# Patient Record
Sex: Female | Born: 1935 | Race: White | Hispanic: No | Smoking: Never smoker
Health system: Southern US, Community
[De-identification: ages and names within clinical notes are randomized; demographics above are authoritative.]

## PROBLEM LIST (undated history)

## (undated) DIAGNOSIS — K8689 Other specified diseases of pancreas: Secondary | ICD-10-CM

## (undated) DIAGNOSIS — R110 Nausea: Secondary | ICD-10-CM

## (undated) DIAGNOSIS — R42 Dizziness and giddiness: Secondary | ICD-10-CM

## (undated) DIAGNOSIS — I10 Essential (primary) hypertension: Secondary | ICD-10-CM

## (undated) DIAGNOSIS — R5382 Chronic fatigue, unspecified: Secondary | ICD-10-CM

## (undated) DIAGNOSIS — B351 Tinea unguium: Secondary | ICD-10-CM

## (undated) DIAGNOSIS — E860 Dehydration: Secondary | ICD-10-CM

## (undated) HISTORY — DX: Tinea unguium: B35.1

## (undated) HISTORY — DX: Other specified diseases of pancreas: K86.89

## (undated) HISTORY — DX: Essential (primary) hypertension: I10

## (undated) HISTORY — DX: Dizziness and giddiness: R42

## (undated) HISTORY — DX: Chronic fatigue, unspecified: R53.82

## (undated) HISTORY — DX: Nausea: R11.0

## (undated) HISTORY — DX: Dehydration: E86.0

---

## 1997-07-10 HISTORY — PX: THYROIDECTOMY: SHX17

## 2003-10-16 ENCOUNTER — Emergency Department (HOSPITAL_COMMUNITY): Admission: EM | Admit: 2003-10-16 | Discharge: 2003-10-16 | Payer: Self-pay | Admitting: Family Medicine

## 2016-04-09 DEATH — deceased

## 2020-01-21 ENCOUNTER — Other Ambulatory Visit: Payer: Self-pay

## 2020-01-21 ENCOUNTER — Ambulatory Visit (INDEPENDENT_AMBULATORY_CARE_PROVIDER_SITE_OTHER): Payer: Medicare Other | Admitting: Podiatry

## 2020-01-21 DIAGNOSIS — B351 Tinea unguium: Secondary | ICD-10-CM

## 2020-01-21 DIAGNOSIS — M79676 Pain in unspecified toe(s): Secondary | ICD-10-CM

## 2020-01-23 NOTE — Progress Notes (Signed)
   SUBJECTIVE Patient presents to office today complaining of elongated, thickened nails that cause pain while ambulating in shoes.  She is unable to trim her own nails.  Patient is on anticoagulant therapy and concern to trim her nails.  She states that her nails have not been trimmed for over a year due to Covid.  They are long and thick and growing upwards and are very painful and tender with shoes.  Patient is here for further evaluation and treatment.  No past medical history on file.  OBJECTIVE General Patient is awake, alert, and oriented x 3 and in no acute distress. Derm Skin is dry and supple bilateral. Negative open lesions or macerations. Remaining integument unremarkable. Nails are tender, long, thickened and dystrophic with subungual debris, consistent with onychomycosis, 1-5 bilateral. No signs of infection noted. Vasc  DP and PT pedal pulses palpable bilaterally. Temperature gradient within normal limits.  Neuro Epicritic and protective threshold sensation grossly intact bilaterally.  Musculoskeletal Exam No symptomatic pedal deformities noted bilateral. Muscular strength within normal limits.  ASSESSMENT 1. Onychodystrophic nails 1-5 bilateral with hyperkeratosis of nails.  2. Onychomycosis of nail due to dermatophyte bilateral 3. Pain in foot bilateral  PLAN OF CARE 1. Patient evaluated today.  2. Instructed to maintain good pedal hygiene and foot care.  3. Mechanical debridement of nails 1-5 bilaterally performed using a nail nipper. Filed with dremel without incident.  4. Return to clinic in 3 mos.    Felecia Shelling, DPM [facility]  Dr. Felecia Shelling, DPM    [address]                                        [place], [place] [address]                Office [phone]  Fax [phone]

## 2020-02-04 ENCOUNTER — Encounter: Payer: Self-pay | Admitting: Nurse Practitioner

## 2020-02-04 ENCOUNTER — Other Ambulatory Visit: Payer: Self-pay

## 2020-02-04 ENCOUNTER — Ambulatory Visit (INDEPENDENT_AMBULATORY_CARE_PROVIDER_SITE_OTHER): Payer: Medicare Other | Admitting: Nurse Practitioner

## 2020-02-04 VITALS — BP 162/70 | HR 61 | Temp 97.1°F | Ht 63.0 in | Wt 144.0 lb

## 2020-02-04 DIAGNOSIS — K5790 Diverticulosis of intestine, part unspecified, without perforation or abscess without bleeding: Secondary | ICD-10-CM | POA: Diagnosis not present

## 2020-02-04 DIAGNOSIS — E890 Postprocedural hypothyroidism: Secondary | ICD-10-CM

## 2020-02-04 DIAGNOSIS — R531 Weakness: Secondary | ICD-10-CM

## 2020-02-04 DIAGNOSIS — K869 Disease of pancreas, unspecified: Secondary | ICD-10-CM

## 2020-02-04 DIAGNOSIS — R911 Solitary pulmonary nodule: Secondary | ICD-10-CM

## 2020-02-04 DIAGNOSIS — I10 Essential (primary) hypertension: Secondary | ICD-10-CM

## 2020-02-04 DIAGNOSIS — R5383 Other fatigue: Secondary | ICD-10-CM

## 2020-02-04 DIAGNOSIS — R634 Abnormal weight loss: Secondary | ICD-10-CM

## 2020-02-04 DIAGNOSIS — Z8673 Personal history of transient ischemic attack (TIA), and cerebral infarction without residual deficits: Secondary | ICD-10-CM

## 2020-02-04 DIAGNOSIS — Z9009 Acquired absence of other part of head and neck: Secondary | ICD-10-CM | POA: Diagnosis not present

## 2020-02-04 DIAGNOSIS — B351 Tinea unguium: Secondary | ICD-10-CM

## 2020-02-04 DIAGNOSIS — E559 Vitamin D deficiency, unspecified: Secondary | ICD-10-CM

## 2020-02-04 DIAGNOSIS — M79676 Pain in unspecified toe(s): Secondary | ICD-10-CM

## 2020-02-04 DIAGNOSIS — R42 Dizziness and giddiness: Secondary | ICD-10-CM

## 2020-02-04 NOTE — Progress Notes (Signed)
Careteam: Patient Care Team: Sharon SellerEubanks, Shakeena Kafer K, NP as PCP - General (Geriatric Medicine)  PL[facility] SERVICE[facility]  [facility]  Advanced Directive information    No Known Allergies  Chief Complaint  Patient presents with   Establish Care     New patient establish care. Here with son, Trey PaulaJeff    Nausea    Patient c/o nausea, takes baking soda to help with symptoms. Question if any of current medications is the source of nausea.    Fatigue    Patient c/o no energy and unable to stand alone. Patient uses a walker.    Medication Management    All medications present at initial appointment      HPI: Patient is a 86 y.o. female to establish care.   Osteopenia- unsure when her last bone density but "not too long ago"  Elevated HR/hypertension- taking cardizem 120 mg and irbesartan 150 mg daily, previously blood pressure well controlled. HR controlled generally. Has been on same medication for years.   Chronic back pain- uses gabapentin as needed and mostly just takes at night. Rarely will take during the day.   Reports hx of diverticulitis and feels like she was started on omeprazole  Talks a lot about flare up of her diverticulitis.   She has a hx of vertigo and then she would be nauseous then she would stop eating and drinking and end up hospitalized with dehydration. Vertigo has been chronic and unable to lay flat, sleeps with elevated bed. Family ensures that she is fully hydrated 8 8 oz glasses of water a day. Daughter cooks for her. She has no appetite.  No vomiting.  Taking baking soda which helps the nausea.   AWV- last in November --11/10 and last blood work around that time.     Reports ongoing weakness- she also is working to improve painful fungal infection to her feet and toes.   Review of Systems:  Review of Systems  Constitutional: Positive for malaise/fatigue and weight loss. Negative for chills and fever.  HENT: Negative for tinnitus.   Respiratory:  Negative for cough, sputum production and shortness of breath.   Cardiovascular: Negative for chest pain, palpitations and leg swelling.  Gastrointestinal: Positive for diarrhea and nausea. Negative for abdominal pain, constipation and heartburn.  Genitourinary: Positive for frequency. Negative for dysuria and urgency.  Musculoskeletal: Positive for back pain. Negative for falls, joint pain and myalgias.  Skin: Negative.        Fungal infection to bilateral feet/toenails being followed by podiatry   Neurological: Positive for dizziness and weakness. Negative for headaches.  Psychiatric/Behavioral: Negative for depression and memory loss. The patient does not have insomnia.     Past Medical History:  Diagnosis Date   Chronic fatigue    Per [facility] new patient packet   Dehydration    Per [facility] new patient packet   High blood pressure    Per [facility] new patient packet   Mass of pancreas    Per [facility] new patient packet   Nausea    Per [facility] new patient packet   Toenail fungus    Per [facility] new patient packet   Vertigo    Per [facility] new patient packet   Past Surgical History:  Procedure Laterality Date   THYROIDECTOMY  1999   Per [facility] new patient packet   Social History:   reports that she has never smoked. She has never used smokeless tobacco. She reports previous alcohol use. She reports that  she does not use drugs.  Family History  Problem Relation Age of Onset   Heart attack Mother    Gaucher's disease Mother    Heart attack Father    Heart attack Brother    Thyroid disease Daughter    High blood pressure Son     Medications: Patient's Medications  New Prescriptions   No medications on file  Previous Medications   CHOLECALCIFEROL 25 MCG (1000 UT) CAPSULE    Take 1 capsule by mouth daily.   DILTIAZEM (CARDIZEM CD) 120 MG 24 HR CAPSULE    Take 120 mg by mouth daily.   GABAPENTIN (NEURONTIN) 100 MG CAPSULE    Take 1 capsule by mouth 2 (two) times daily.   IRBESARTAN  (AVAPRO) 150 MG TABLET    Take 150 mg by mouth daily.   MULTIPLE VITAMIN (MULTI-VITAMIN) TABLET    Take 1 tablet by mouth daily.   OMEPRAZOLE (PRILOSEC) 20 MG CAPSULE    Take 20 mg by mouth daily.  Modified Medications   No medications on file  Discontinued Medications   No medications on file    Physical Exam:  Vitals:   02/04/20 1328  BP: (!) 162/70  Pulse: 61  Temp: (!) 97.1 F (36.2 C)  TempSrc: Temporal  SpO2: 97%  Weight: 144 lb (65.3 kg)  Height: 5\' 3"  (1.6 m)   Body mass index is 25.51 kg/m. Wt Readings from Last 3 Encounters:  02/04/20 144 lb (65.3 kg)    Physical Exam Constitutional:      General: She is not in acute distress.    Appearance: She is well-developed. She is not diaphoretic.  HENT:     Head: Normocephalic and atraumatic.     Mouth/Throat:     Pharynx: No oropharyngeal exudate.  Eyes:     Conjunctiva/sclera: Conjunctivae normal.     Pupils: Pupils are equal, round, and reactive to light.  Cardiovascular:     Rate and Rhythm: Normal rate and regular rhythm.     Heart sounds: Normal heart sounds.  Pulmonary:     Effort: Pulmonary effort is normal.     Breath sounds: Normal breath sounds.  Abdominal:     General: Bowel sounds are normal.     Palpations: Abdomen is soft.  Musculoskeletal:        General: No tenderness.     Cervical back: Normal range of motion and neck supple.  Skin:    General: Skin is warm and dry.  Neurological:     Mental Status: She is alert and oriented to person, place, and time.     Gait: Gait abnormal (in wheelchair).     Labs reviewed: Basic Metabolic Panel: No results for input(s): NA, K, CL, CO2, GLUCOSE, BUN, CREATININE, CALCIUM, MG, PHOS, TSH in the last 8760 hours. Liver Function Tests: No results for input(s): AST, ALT, ALKPHOS, BILITOT, PROT, ALBUMIN in the last 8760 hours. No results for input(s): LIPASE, AMYLASE in the last 8760 hours. No results for input(s): AMMONIA in the last 8760  hours. CBC: No results for input(s): WBC, NEUTROABS, HGB, HCT, MCV, PLT in the last 8760 hours. Lipid Panel: No results for input(s): CHOL, HDL, LDLCALC, TRIG, CHOLHDL, LDLDIRECT in the last 8760 hours. TSH: No results for input(s): TSH in the last 8760 hours. A1C: No results found for: HGBA1C  FROM [facility] MY[facility]ART CT ABDOMEN PELVIS WITH CONTRAST  Comparison: None.  Indication: Unlisted Indication-See Free Text, diarrhea, low back pain, R53.1 Weakness.  Technique: CT imaging was  performed of the abdomen and pelvis following the administration of intravenous contrast ([id number]-300). Iodinated contrast was used due to the indications for the examination, to improve disease detection and further define anatomy. The most recent serum creatinine is Within normal limits. Coronal and sagittal reformatted images were generated and reviewed.  Findings:   There is a 0.6 cm solid-appearing nodule in the right middle lobe (axial image 13). Linear opacities in the lingula likely represent scarring versus subsegmental atelectasis. There are no pleural effusions.  Fatty infiltration of the falciform ligament. Multiple low-attenuation lesions in the liver likely represent cysts, though some are too small to fully characterize. The liver is otherwise unremarkable. No intra or extrahepatic biliary ductal dilatation. Normal gallbladder.  Coarse calcification is noted in the left adrenal gland, nonspecific. The spleen is unremarkable.  There is a 1.9 x 1.2 cm low-attenuation lesion in the pancreatic tail (11 HU on axial image 44). There may be an additional 0.6 cm low-attenuation lesion in the region of the pancreatic head (axial image 52). Low-attenuation lesions in the bilateral kidneys are too small to fully characterize. Right extrarenal pelvis. Mild left atelectasis versus parapelvic cysts without clear obstructing cause identified.  Small hiatal hernia. Diverticulosis. There is  focal bowel wall thickening in the region of the sigmoid colon with mild surrounding mesenteric stranding. The appendix is not clearly visualized, however, no findings in the expected region of the appendix to suggest acute appendicitis. No free air. No free fluid. There is no evidence of retroperitoneal, pelvic, mesenteric, or inguinal lymphadenopathy. Small fat-containing umbilical hernia.  The aorta is normal in caliber. Moderate to severe atherosclerotic plaque throughout the visualized thoracic and abdominal aorta.   The bladder is normal. The uterus and ovaries are unremarkable in appearance.  The bone windows demonstrate no evidence of destructive, lytic, or blastic lesions.  Impression:  1. Findings favored to represent mild uncomplicated sigmoid diverticulitis. No evidence of perforation or abscess.  Recommend correlation with colonoscopy. 2. Incidentally identified 1.9 centimeter low-attenuation cystic lesion in the pancreatic tail. This could represent an IPMN, cystic neoplasm, or pseudocyst. A 1 year follow-up CT is recommended to ensure stability.  3. There is an apparent 0.6 cm right middle lobe pulmonary nodule. If the patient is low risk for lung cancer, recommend follow-up CT at 6-12 months and optional CT at 18-24 months. If the patient is high risk for lung cancer, recommend follow-up CT at 6-12 months and again at 18-24 months to ensure stability. (2017 Fleischner Guidelines)  This study was placed in the unexpected findings folder for impressions #2 and 3.  Electronically Signed by: Harvin Hazel, MD, [facility] Electronically Signed on: 05/27/2018 4:50 PM  --------  CT BRAIN WITHOUT CONTRAST  Comparison: 08/20/2015  Indication: Unlisted Indication-See Free Text, AMS, R53.1 Weakness  Technique: Contiguous axial images were obtained from the skull base through the vertex without IV contrast.   Findings:  No evidence of mass effect or  midline shift. Negative for space-occupying lesion or intracranial hemorrhage. No evidence of acute cortical-based area of infarction. Periventricular hypodensities compatible with chronic small vessel ischemic changes. Vascular calcifications. Old lacunar infarct left basal ganglia.  No extra-axial fluid collections. Ventricles and sulci are appropriate for the patient's age. Basal cisterns are patent.  Status post bilateral cataract surgery. Paranasal sinuses and mastoids are normal.   Impression:  No acute intracranial process.  Electronically Signed by: Harvin Hazel, MD, [facility] Electronically Signed on: 05/27/2018 4:18 PM  Assessment/Plan 1. H/O partial thyroidectomy - TSH  2. Essential hypertension -elevated today in office but she does not like coming to appts. Encouraged home monitoring and to notify if remains elevated, goal <140/90. Low sodium diet encouraged. - CBC with Differential/Platelet - COMPLETE METABOLIC PANEL WITH GFR  3. Diverticulosis -hx of with flares in the past, diet encouraged.  - CBC with Differential/Platelet - COMPLETE METABOLIC PANEL WITH GFR  4. Pancreatic lesion Noted on imaging, would like follow up.  - CT Abdomen Pelvis W Contrast; Future  5. Lesion of right lung -noted on imaging, would like follow up. - CT Chest Wo Contrast; Future  6. Fatigue, unspecified type Ongoing. Will follow up labs at this time. To continue proper hydration and nutrition. Encouraged increase in physical activity as tolerates, she is very sedentary.  - TSH  7. Weakness Due to debility, recommend PT/OT she wants to hold off until onychomycosis of toenails improves further.   8. Pain due to onychomycosis of toenail Being treated by podiatry, with improvement. Previously was painful to walk this is now improving however with increase weakness and balance issues. She would likely benefit from PT but would like to hold off until treatment is  complete.  9. Vertigo Long standing issues. Family make sure she is properly hydrated with proper nutrition.   10. Postoperative hypothyroidism TSH  11. H/O: CVA (cerebrovascular accident) Question if hx of cva contributing to vertigo. ?not on ASA, possible due to GI upset with hx of nausea  12. Vitamin D deficiency Continues on supplement, last Vit D level in nov was 58   13. Weight loss On review of records her weight was 130 lbs in nov 2020 so it appears she has had a positive weight gain at this time.   Next appt: 4 months, sooner if needed  Danton Palmateer K. Biagio Borg  [facility] [phone]

## 2020-02-04 NOTE — Patient Instructions (Addendum)
Continue to work on getting 3 meals a day with proper water intake  To schedule AWV at after 05/19/2020  Follow up in 4 months.

## 2020-02-05 ENCOUNTER — Ambulatory Visit: Payer: Self-pay | Admitting: Nurse Practitioner

## 2020-02-05 LAB — COMPLETE METABOLIC PANEL WITH GFR
AG Ratio: 1.3 (calc) (ref 1.0–2.5)
ALT: 12 U/L (ref 6–29)
AST: 16 U/L (ref 10–35)
Albumin: 4 g/dL (ref 3.6–5.1)
Alkaline phosphatase (APISO): 95 U/L (ref 37–153)
BUN: 15 mg/dL (ref 7–25)
CO2: 26 mmol/L (ref 20–32)
Calcium: 9.3 mg/dL (ref 8.6–10.4)
Chloride: 97 mmol/L — ABNORMAL LOW (ref 98–110)
Creat: 0.84 mg/dL (ref 0.60–0.88)
GFR, Est African American: 69 mL/min/{1.73_m2} (ref >=60)
GFR, Est Non African American: 60 mL/min/{1.73_m2} (ref >=60)
Globulin: 3.2 g/dL (calc) (ref 1.9–3.7)
Glucose, Bld: 103 mg/dL (ref 65–139)
Potassium: 4.6 mmol/L (ref 3.5–5.3)
Sodium: 132 mmol/L — ABNORMAL LOW (ref 135–146)
Total Bilirubin: 0.4 mg/dL (ref 0.2–1.2)
Total Protein: 7.2 g/dL (ref 6.1–8.1)

## 2020-02-05 LAB — CBC WITH DIFFERENTIAL/PLATELET
Absolute Monocytes: 813 cells/uL (ref 200–950)
Basophils Absolute: 20 cells/uL (ref 0–200)
Basophils Relative: 0.3 %
Eosinophils Absolute: 39 cells/uL (ref 15–500)
Eosinophils Relative: 0.6 %
HCT: 39.7 % (ref 35.0–45.0)
Hemoglobin: 13 g/dL (ref 11.7–15.5)
Lymphs Abs: 1365 cells/uL (ref 850–3900)
MCH: 29.8 pg (ref 27.0–33.0)
MCHC: 32.7 g/dL (ref 32.0–36.0)
MCV: 91.1 fL (ref 80.0–100.0)
MPV: 9.3 fL (ref 7.5–12.5)
Monocytes Relative: 12.5 %
Neutro Abs: 4264 cells/uL (ref 1500–7800)
Neutrophils Relative %: 65.6 %
Platelets: 282 10*3/uL (ref 140–400)
RBC: 4.36 10*6/uL (ref 3.80–5.10)
RDW: 11.8 % (ref 11.0–15.0)
Total Lymphocyte: 21 %
WBC: 6.5 10*3/uL (ref 3.8–10.8)

## 2020-02-05 LAB — TSH: TSH: 1.4 mIU/L (ref 0.40–4.50)

## 2020-02-06 DIAGNOSIS — E890 Postprocedural hypothyroidism: Secondary | ICD-10-CM | POA: Insufficient documentation

## 2020-02-06 DIAGNOSIS — M79676 Pain in unspecified toe(s): Secondary | ICD-10-CM | POA: Insufficient documentation

## 2020-02-06 DIAGNOSIS — I10 Essential (primary) hypertension: Secondary | ICD-10-CM | POA: Insufficient documentation

## 2020-02-06 DIAGNOSIS — R42 Dizziness and giddiness: Secondary | ICD-10-CM | POA: Insufficient documentation

## 2020-02-06 DIAGNOSIS — K5790 Diverticulosis of intestine, part unspecified, without perforation or abscess without bleeding: Secondary | ICD-10-CM | POA: Insufficient documentation

## 2020-02-06 DIAGNOSIS — E559 Vitamin D deficiency, unspecified: Secondary | ICD-10-CM | POA: Insufficient documentation

## 2020-02-06 DIAGNOSIS — Z8673 Personal history of transient ischemic attack (TIA), and cerebral infarction without residual deficits: Secondary | ICD-10-CM | POA: Insufficient documentation

## 2020-03-22 ENCOUNTER — Other Ambulatory Visit: Payer: Self-pay

## 2020-03-22 ENCOUNTER — Ambulatory Visit (INDEPENDENT_AMBULATORY_CARE_PROVIDER_SITE_OTHER): Payer: Medicare Other | Admitting: Nurse Practitioner

## 2020-03-22 VITALS — BP 144/82 | HR 94 | Temp 97.1°F

## 2020-03-22 DIAGNOSIS — R413 Other amnesia: Secondary | ICD-10-CM | POA: Diagnosis not present

## 2020-03-22 DIAGNOSIS — R8290 Unspecified abnormal findings in urine: Secondary | ICD-10-CM | POA: Diagnosis not present

## 2020-03-22 DIAGNOSIS — R531 Weakness: Secondary | ICD-10-CM

## 2020-03-22 DIAGNOSIS — B351 Tinea unguium: Secondary | ICD-10-CM

## 2020-03-22 DIAGNOSIS — M79676 Pain in unspecified toe(s): Secondary | ICD-10-CM

## 2020-03-22 NOTE — Progress Notes (Signed)
Careteam: Patient Care Team: Sharon Seller, NP as PCP - General (Geriatric Medicine)  PLACE OF SERVICE:  [facility]  Advanced Directive information    No Known Allergies  Chief Complaint  Patient presents with  . Acute Visit    Per patient feels bad all the time and less engery. Patient son questions if patient had a recent stroke. Patient also with frequent urination. Patient denies pain or discomfort when urinating. Patient also would like ears checked. Patients son would also like standard blood work checked. Here with son [name].   . Referral    Home health referral request   . Immunizations    Refused flu vaccine today      HPI: Patient is a 86 y.o. female with progressive weakness.   Son reports she is doing more sleeping.  Reports she feels weak getting up and getting down. More forgetful.  In the last few weeks having a hard time having sentences.  Reports ongoing dizziness, can not lay flat due to this. Sleeps propped up, has been doing that for years.   Has ocular migraines, more this past week. They last about 20 mins.   Reports they are waiting to have physical therapy at this time. Has gotten progressively weak after painful toenails.  Not eating well at all.  Did not want to be weighed today, son reports he feels like the weight was off at last visit.   No pain or abnormal urination    Review of Systems:  Review of Systems  Constitutional: Positive for malaise/fatigue and weight loss. Negative for chills and fever.  HENT: Positive for hearing loss. Negative for tinnitus.   Respiratory: Negative for cough, sputum production and shortness of breath.   Cardiovascular: Negative for chest pain, palpitations and leg swelling.  Gastrointestinal: Negative for abdominal pain, constipation, diarrhea and heartburn.  Genitourinary: Negative for dysuria, frequency and urgency.  Musculoskeletal: Negative for back pain, falls, joint pain and myalgias.  Skin:  Negative.   Neurological: Positive for weakness and headaches. Negative for dizziness.  Psychiatric/Behavioral: Positive for memory loss. Negative for depression. The patient is nervous/anxious. The patient does not have insomnia.     Past Medical History:  Diagnosis Date  . Chronic fatigue    Per [facility] new patient packet  . Dehydration    Per [facility] new patient packet  . High blood pressure    Per [facility] new patient packet  . Mass of pancreas    Per [facility] new patient packet  . Nausea    Per [facility] new patient packet  . Toenail fungus    Per [facility] new patient packet  . Vertigo    Per [facility] new patient packet   Past Surgical History:  Procedure Laterality Date  . THYROIDECTOMY  1999   Per [facility] new patient packet   Social History:   reports that she has never smoked. She has never used smokeless tobacco. She reports previous alcohol use. She reports that she does not use drugs.  Family History  Problem Relation Age of Onset  . Heart attack Mother   . Gaucher's disease Mother   . Heart attack Father   . Heart attack Brother   . Thyroid disease Daughter   . High blood pressure Son     Medications: Patient's Medications  New Prescriptions   No medications on file  Previous Medications   CHOLECALCIFEROL 25 MCG (1000 UT) CAPSULE    Take 1 capsule by mouth daily.  DILTIAZEM (CARDIZEM CD) 120 MG 24 HR CAPSULE    Take 120 mg by mouth daily.   GABAPENTIN (NEURONTIN) 100 MG CAPSULE    Take 1 capsule by mouth as needed.    IRBESARTAN (AVAPRO) 150 MG TABLET    Take 150 mg by mouth daily.   MULTIPLE VITAMIN (MULTI-VITAMIN) TABLET    Take 1 tablet by mouth daily.  Modified Medications   No medications on file  Discontinued Medications   OMEPRAZOLE (PRILOSEC) 20 MG CAPSULE    Take 20 mg by mouth daily.    Physical Exam:  Vitals:   03/22/20 1600  BP: (!) 144/82  Pulse: 94  Temp: (!) 97.1 F (36.2 C)  TempSrc: Temporal  SpO2: 97%   There is no height or weight on file to calculate  BMI. Wt Readings from Last 3 Encounters:  02/04/20 144 lb (65.3 kg)    Physical Exam Constitutional:      General: She is not in acute distress.    Appearance: She is well-developed. She is not diaphoretic.  HENT:     Head: Normocephalic and atraumatic.     Mouth/Throat:     Pharynx: No oropharyngeal exudate.  Eyes:     Conjunctiva/sclera: Conjunctivae normal.     Pupils: Pupils are equal, round, and reactive to light.  Cardiovascular:     Rate and Rhythm: Normal rate and regular rhythm.     Heart sounds: Normal heart sounds.  Pulmonary:     Effort: Pulmonary effort is normal.     Breath sounds: Normal breath sounds.  Abdominal:     General: Bowel sounds are normal.     Palpations: Abdomen is soft.  Musculoskeletal:        General: No tenderness.     Cervical back: Normal range of motion and neck supple.     Right lower leg: No edema.     Left lower leg: No edema.  Skin:    General: Skin is warm and dry.  Neurological:     Mental Status: She is alert and oriented to person, place, and time.     Sensory: No sensory deficit.     Motor: Weakness (slow gait, uses walker and wheelchair) present.     Gait: Gait abnormal.  Psychiatric:        Behavior: Behavior normal.     Labs reviewed: Basic Metabolic Panel: Recent Labs    02/04/20 1432  NA 132*  K 4.6  CL 97*  CO2 26  GLUCOSE 103  BUN 15  CREATININE 0.84  CALCIUM 9.3  TSH 1.40   Liver Function Tests: Recent Labs    02/04/20 1432  AST 16  ALT 12  BILITOT 0.4  PROT 7.2   No results for input(s): LIPASE, AMYLASE in the last 8760 hours. No results for input(s): AMMONIA in the last 8760 hours. CBC: Recent Labs    02/04/20 1432  WBC 6.5  NEUTROABS 4,264  HGB 13.0  HCT 39.7  MCV 91.1  PLT 282   Lipid Panel: No results for input(s): CHOL, HDL, LDLCALC, TRIG, CHOLHDL, LDLDIRECT in the last 8760 hours. TSH: Recent Labs    02/04/20 1432  TSH 1.40   A1C: No results found for:  HGBA1C   Assessment/Plan 1. Weakness -progressive weakness, ?related to progressive decline vs acute infection/stroke Pt unable to tolerate CT head -will get labs to rule out infection or electrolyte abnormality -son request Vit d level.  - Ambulatory referral to Home Health - Vitamin B12 -  COMPLETE METABOLIC PANEL WITH GFR - Vitamin D, 25-hydroxy - CBC with Differential/Platelet  2. Pain due to onychomycosis of toenail -pain has made it harder for her to walk, has been seeing podiatry with improvement in pain, now with worsening weakness  - Ambulatory referral to Home Health for PT/OT evaluation  3. Memory loss -worsening confusion, son concerned over stroke vs infection.  -pt and son reports she would not be able to tolerate CT scan for imaging and therefore decline test.  - Vitamin B12 - COMPLETE METABOLIC PANEL WITH GFR - Vitamin D, 25-hydroxy - CBC with Differential/Platelet  Next appt: 06/09/2020 Erin Escobar. Biagio Borg  [facility] [phone]

## 2020-03-23 LAB — COMPLETE METABOLIC PANEL WITH GFR
AG Ratio: 1.5 (calc) (ref 1.0–2.5)
ALT: 11 U/L (ref 6–29)
AST: 14 U/L (ref 10–35)
Albumin: 4.1 g/dL (ref 3.6–5.1)
Alkaline phosphatase (APISO): 89 U/L (ref 37–153)
BUN/Creatinine Ratio: 15 (calc) (ref 6–22)
BUN: 14 mg/dL (ref 7–25)
CO2: 25 mmol/L (ref 20–32)
Calcium: 9.4 mg/dL (ref 8.6–10.4)
Chloride: 96 mmol/L — ABNORMAL LOW (ref 98–110)
Creat: 0.94 mg/dL — ABNORMAL HIGH (ref 0.60–0.88)
GFR, Est African American: 61 mL/min/{1.73_m2} (ref >=60)
GFR, Est Non African American: 52 mL/min/{1.73_m2} — ABNORMAL LOW (ref >=60)
Globulin: 2.8 g/dL (calc) (ref 1.9–3.7)
Glucose, Bld: 116 mg/dL (ref 65–139)
Potassium: 4.4 mmol/L (ref 3.5–5.3)
Sodium: 130 mmol/L — ABNORMAL LOW (ref 135–146)
Total Bilirubin: 0.4 mg/dL (ref 0.2–1.2)
Total Protein: 6.9 g/dL (ref 6.1–8.1)

## 2020-03-23 LAB — CBC WITH DIFFERENTIAL/PLATELET
Absolute Monocytes: 876 cells/uL (ref 200–950)
Basophils Absolute: 41 cells/uL (ref 0–200)
Basophils Relative: 0.6 %
Eosinophils Absolute: 21 cells/uL (ref 15–500)
Eosinophils Relative: 0.3 %
HCT: 38.2 % (ref 35.0–45.0)
Hemoglobin: 12.8 g/dL (ref 11.7–15.5)
Lymphs Abs: 1697 cells/uL (ref 850–3900)
MCH: 30.4 pg (ref 27.0–33.0)
MCHC: 33.5 g/dL (ref 32.0–36.0)
MCV: 90.7 fL (ref 80.0–100.0)
MPV: 9.4 fL (ref 7.5–12.5)
Monocytes Relative: 12.7 %
Neutro Abs: 4264 cells/uL (ref 1500–7800)
Neutrophils Relative %: 61.8 %
Platelets: 297 10*3/uL (ref 140–400)
RBC: 4.21 10*6/uL (ref 3.80–5.10)
RDW: 12.1 % (ref 11.0–15.0)
Total Lymphocyte: 24.6 %
WBC: 6.9 10*3/uL (ref 3.8–10.8)

## 2020-03-23 LAB — VITAMIN D 25 HYDROXY (VIT D DEFICIENCY, FRACTURES): Vit D, 25-Hydroxy: 52 ng/mL (ref 30–100)

## 2020-03-23 LAB — VITAMIN B12: Vitamin B-12: 933 pg/mL (ref 200–1100)

## 2020-03-24 ENCOUNTER — Other Ambulatory Visit: Payer: Self-pay

## 2020-03-24 DIAGNOSIS — E871 Hypo-osmolality and hyponatremia: Secondary | ICD-10-CM

## 2020-03-24 LAB — URINE CULTURE
MICRO NUMBER:: 10945266
Result:: NO GROWTH
SPECIMEN QUALITY:: ADEQUATE

## 2020-03-25 ENCOUNTER — Other Ambulatory Visit: Payer: Self-pay

## 2020-03-25 ENCOUNTER — Telehealth: Payer: Self-pay | Admitting: *Deleted

## 2020-03-25 DIAGNOSIS — E871 Hypo-osmolality and hyponatremia: Secondary | ICD-10-CM

## 2020-03-25 NOTE — Telephone Encounter (Signed)
[place], daughter called and wanted to know since patient's sodium is low could that cause Confusion. Stated that several mornings patient wakes up in the morning with confusion. Stated that patient does go to bed super early. Stated that it gets better after patient gets food and drink.  Please Advise.

## 2020-03-25 NOTE — Telephone Encounter (Signed)
Patient daughter notified and agreed.  

## 2020-03-25 NOTE — Telephone Encounter (Signed)
Could be the cause but generally it would not be transient confusion. I know that she has had progressive confusion and does have hx of stroke based on her chart, this can also lead to confusion.

## 2020-03-27 LAB — OSMOLALITY, URINE: Osmolality, Ur: 444 mOsm/kg (ref 50–1200)

## 2020-03-27 LAB — SODIUM, URINE, RANDOM: Sodium, Ur: 69 mmol/L (ref 28–272)

## 2020-03-31 ENCOUNTER — Telehealth: Payer: Self-pay

## 2020-03-31 NOTE — Telephone Encounter (Signed)
Charisse from [facility] states delay in care giver request so home health is starting today.

## 2020-04-01 ENCOUNTER — Emergency Department (HOSPITAL_COMMUNITY): Payer: Medicare Other

## 2020-04-01 ENCOUNTER — Encounter (HOSPITAL_COMMUNITY): Payer: Self-pay | Admitting: Emergency Medicine

## 2020-04-01 ENCOUNTER — Emergency Department (HOSPITAL_COMMUNITY)
Admission: EM | Admit: 2020-04-01 | Discharge: 2020-04-09 | Disposition: E | Payer: Medicare Other | Attending: Emergency Medicine | Admitting: Emergency Medicine

## 2020-04-01 DIAGNOSIS — I469 Cardiac arrest, cause unspecified: Secondary | ICD-10-CM | POA: Insufficient documentation

## 2020-04-01 DIAGNOSIS — R4182 Altered mental status, unspecified: Secondary | ICD-10-CM | POA: Insufficient documentation

## 2020-04-01 DIAGNOSIS — Z79899 Other long term (current) drug therapy: Secondary | ICD-10-CM | POA: Insufficient documentation

## 2020-04-01 DIAGNOSIS — I4901 Ventricular fibrillation: Secondary | ICD-10-CM

## 2020-04-01 DIAGNOSIS — I10 Essential (primary) hypertension: Secondary | ICD-10-CM | POA: Diagnosis not present

## 2020-04-01 DIAGNOSIS — E039 Hypothyroidism, unspecified: Secondary | ICD-10-CM | POA: Diagnosis not present

## 2020-04-01 DIAGNOSIS — Z20822 Contact with and (suspected) exposure to covid-19: Secondary | ICD-10-CM | POA: Diagnosis not present

## 2020-04-01 LAB — I-STAT CHEM 8, ED
BUN: 19 mg/dL (ref 8–23)
Calcium, Ion: 1.13 mmol/L — ABNORMAL LOW (ref 1.15–1.40)
Chloride: 101 mmol/L (ref 98–111)
Creatinine, Ser: 1 mg/dL (ref 0.44–1.00)
Glucose, Bld: 292 mg/dL — ABNORMAL HIGH (ref 70–99)
HCT: 32 % — ABNORMAL LOW (ref 36.0–46.0)
Hemoglobin: 10.9 g/dL — ABNORMAL LOW (ref 12.0–15.0)
Potassium: 3.2 mmol/L — ABNORMAL LOW (ref 3.5–5.1)
Sodium: 133 mmol/L — ABNORMAL LOW (ref 135–145)
TCO2: 18 mmol/L — ABNORMAL LOW (ref 22–32)

## 2020-04-01 LAB — I-STAT ARTERIAL BLOOD GAS, ED
Acid-base deficit: 9 mmol/L — ABNORMAL HIGH (ref 0.0–2.0)
Bicarbonate: 19.1 mmol/L — ABNORMAL LOW (ref 20.0–28.0)
Calcium, Ion: 1.14 mmol/L — ABNORMAL LOW (ref 1.15–1.40)
HCT: 33 % — ABNORMAL LOW (ref 36.0–46.0)
Hemoglobin: 11.2 g/dL — ABNORMAL LOW (ref 12.0–15.0)
O2 Saturation: 95 %
Patient temperature: 96
Potassium: 2.9 mmol/L — ABNORMAL LOW (ref 3.5–5.1)
Sodium: 136 mmol/L (ref 135–145)
TCO2: 21 mmol/L — ABNORMAL LOW (ref 22–32)
pCO2 arterial: 47 mmHg (ref 32.0–48.0)
pH, Arterial: 7.209 — ABNORMAL LOW (ref 7.350–7.450)
pO2, Arterial: 87 mmHg (ref 83.0–108.0)

## 2020-04-01 LAB — CBC WITH DIFFERENTIAL/PLATELET
Abs Immature Granulocytes: 0.73 10*3/uL — ABNORMAL HIGH (ref 0.00–0.07)
Basophils Absolute: 0.1 10*3/uL (ref 0.0–0.1)
Basophils Relative: 1 %
Eosinophils Absolute: 0.1 10*3/uL (ref 0.0–0.5)
Eosinophils Relative: 1 %
HCT: 38.2 % (ref 36.0–46.0)
Hemoglobin: 12 g/dL (ref 12.0–15.0)
Immature Granulocytes: 4 %
Lymphocytes Relative: 25 %
Lymphs Abs: 5.1 10*3/uL — ABNORMAL HIGH (ref 0.7–4.0)
MCH: 30.3 pg (ref 26.0–34.0)
MCHC: 31.4 g/dL (ref 30.0–36.0)
MCV: 96.5 fL (ref 80.0–100.0)
Monocytes Absolute: 0.9 10*3/uL (ref 0.1–1.0)
Monocytes Relative: 4 %
Neutro Abs: 13.4 10*3/uL — ABNORMAL HIGH (ref 1.7–7.7)
Neutrophils Relative %: 65 %
Platelets: 238 10*3/uL (ref 150–400)
RBC: 3.96 MIL/uL (ref 3.87–5.11)
RDW: 12.8 % (ref 11.5–15.5)
WBC: 20.3 10*3/uL — ABNORMAL HIGH (ref 4.0–10.5)
nRBC: 0 % (ref 0.0–0.2)

## 2020-04-01 LAB — BASIC METABOLIC PANEL
Anion gap: 16 — ABNORMAL HIGH (ref 5–15)
BUN: 16 mg/dL (ref 8–23)
CO2: 16 mmol/L — ABNORMAL LOW (ref 22–32)
Calcium: 8.5 mg/dL — ABNORMAL LOW (ref 8.9–10.3)
Chloride: 101 mmol/L (ref 98–111)
Creatinine, Ser: 1.04 mg/dL — ABNORMAL HIGH (ref 0.44–1.00)
GFR calc Af Amer: 54 mL/min — ABNORMAL LOW (ref >60)
GFR calc non Af Amer: 46 mL/min — ABNORMAL LOW (ref >60)
Glucose, Bld: 284 mg/dL — ABNORMAL HIGH (ref 70–99)
Potassium: 3.4 mmol/L — ABNORMAL LOW (ref 3.5–5.1)
Sodium: 133 mmol/L — ABNORMAL LOW (ref 135–145)

## 2020-04-01 LAB — TROPONIN I (HIGH SENSITIVITY): Troponin I (High Sensitivity): 946 ng/L (ref <18)

## 2020-04-01 LAB — LACTIC ACID, PLASMA: Lactic Acid, Venous: 7.6 mmol/L (ref 0.5–1.9)

## 2020-04-01 LAB — SARS CORONAVIRUS 2 BY RT PCR (HOSPITAL ORDER, PERFORMED IN ~~LOC~~ HOSPITAL LAB): SARS Coronavirus 2: NEGATIVE

## 2020-04-01 MED ORDER — LEVETIRACETAM IN NACL 1000 MG/100ML IV SOLN
1000.0000 mg | Freq: Once | INTRAVENOUS | Status: AC
  Administered 2020-04-01: 1000 mg via INTRAVENOUS
  Filled 2020-04-01: qty 100

## 2020-04-01 MED ORDER — POTASSIUM CHLORIDE 20 MEQ/15ML (10%) PO SOLN: 40.0000 meq | Freq: Once | ORAL | Status: DC

## 2020-04-01 MED ORDER — LORAZEPAM 2 MG/ML IJ SOLN: 1.0000 mg | INTRAMUSCULAR | Status: DC | PRN

## 2020-04-01 MED ORDER — SODIUM CHLORIDE 0.9 % IV BOLUS (SEPSIS)
1000.0000 mL | Freq: Once | INTRAVENOUS | Status: AC
  Administered 2020-04-01: 1000 mL via INTRAVENOUS

## 2020-04-01 MED ORDER — MIDAZOLAM HCL 2 MG/2ML IJ SOLN: 1.0000 mg | INTRAMUSCULAR | Status: DC | PRN

## 2020-04-01 MED ORDER — FENTANYL CITRATE (PF) 100 MCG/2ML IJ SOLN: 50.0000 ug | INTRAMUSCULAR | Status: DC | PRN

## 2020-04-01 MED ORDER — MORPHINE SULFATE (PF) 2 MG/ML IV SOLN
2.0000 mg | INTRAVENOUS | Status: DC | PRN
  Filled 2020-04-01: qty 1

## 2020-04-01 MED ORDER — ETOMIDATE 2 MG/ML IV SOLN
INTRAVENOUS | Status: AC | PRN
  Administered 2020-04-01: 15 mg via INTRAVENOUS

## 2020-04-01 MED ORDER — MORPHINE SULFATE (PF) 2 MG/ML IV SOLN
2.0000 mg | INTRAVENOUS | Status: DC | PRN
  Administered 2020-04-01: 2 mg via INTRAVENOUS

## 2020-04-01 MED ORDER — ROCURONIUM BROMIDE 50 MG/5ML IV SOLN
INTRAVENOUS | Status: AC | PRN
  Administered 2020-04-01: 80 mg via INTRAVENOUS

## 2020-04-01 MED ORDER — SODIUM CHLORIDE 0.9 % IV SOLN
1000.0000 mL | INTRAVENOUS | Status: DC
  Administered 2020-04-01: 1000 mL via INTRAVENOUS

## 2020-04-01 MED ORDER — NOREPINEPHRINE 4 MG/250ML-% IV SOLN
INTRAVENOUS | Status: AC
  Filled 2020-04-01: qty 250

## 2020-04-09 NOTE — ED Provider Notes (Signed)
Patient signed out to me at 7 AM.  Arrives after cardiac arrest.  Was intubated put on vasopressors.  ICU came to evaluate the patient and after discussion with family patient was transition to comfort care and extubated.  Shortly after that I was called to evaluate the patient as she no longer had a pulse and was no longer breathing.  She appeared to be in asystole on the monitor.  No pulse, no respirations.  Patient was pronounced deceased at 7:21 AM.  Suspect cardiac event likely ACS given elevated troponin, V. fib arrest.  Family at the bedside at time of patient passing away.  Will not be a medical examiner case given her age and comorbidities.  No concern for trauma.  Will make primary care team aware of her passing.  Chaplain to be called.  This chart was dictated using voice recognition software.  Despite best efforts to proofread,  errors can occur which can change the documentation meaning.     Virgina Norfolk, DO 2020/04/29 [phone]

## 2020-04-09 NOTE — Procedures (Signed)
Extubation Procedure Note  Patient Details:   Name: Erin Escobar DOB: 1923-10-04 MRN: [id number]   Airway Documentation:    Vent end date: April 13, 2020 Vent end time: 0650   Evaluation  O2 sats: currently acceptable Complications: No apparent complications Patient did tolerate procedure well. Bilateral Breath Sounds: Diminished   No Pt extubated to 4L [place], compassionate extubation. Family in the room.  [name] 04-13-2020, 6:53 AM

## 2020-04-09 NOTE — Telephone Encounter (Signed)
Carlie with [facility] called and stated that patient passed away at [facility]. She just wanted to inform you.

## 2020-04-09 NOTE — Consult Note (Signed)
NAME:  Erin Escobar, MRN:  [id number], DOB:  [birth year], LOS: 0 ADMISSION DATE:  2020-04-23, CONSULTATION DATE:  2020/04/23 REFERRING MD:  Dr. Eudelia Bunch, CHIEF COMPLAINT:  Cardiac Arrest    History of present illness   86 year old female presents to ED 9/23 after being found unresponsive with agonal breaths. CPR started and continued for about 40 minutes, (when EMS arrived and applied pads it was noted to be V.Fib) including 3 shocks. On arrival to ED remained unresponsive with a GCS of 3. Head CT with asymmetric hyperdensity involving the right proximal MCA, concern for infarct. Neurology Consulted. Critical Care Consulted.   Sodium 133. K 3.4. Crt 1.04. Troponin 946  Per Family patient recently went to Primary Care with lethargy. Found to be hyponatremic and per report has had increased urinary output.    Past Medical History  Diverticulosis, HTN, CVA  Significant Hospital Events   9/23 > Presents to ED  Consults:  PCCM Neurology   Procedures:  ETT 9/23   Significant Diagnostic Tests:  CXR 9/23 > Endotracheal tube and gastric catheter are noted in satisfactory position. Cardiac shadow is within normal limits. Diffuse airspace opacity is noted consistent with mild edema. No acute bony abnormality is seen. CT Head 9/23 > Asymmetric hyperdensity involving the right proximal MCA. If there is concordant findings on neurological examination suggestive of a right MCA distribution infarct, this could be confirmed with CT arteriography.Moderate senescent change.  Micro Data:  N/A  Antimicrobials:  N/A   Interim history/subjective:  As above.   Objective   Blood pressure (!) 178/87, pulse 81, temperature (!) 91.4 F (33 C), resp. rate (!) 7, height 5\' 3"  (1.6 m), weight 65.3 kg, SpO2 100 %.    Vent Mode: PRVC FiO2 (%):  [100 %] 100 % Set Rate:  [12 bmp] 12 bmp Vt Set:  [420 mL] 420 mL PEEP:  [5 cmH20] 5 cmH20 Plateau Pressure:  [13 cmH20] 13 cmH20   Intake/Output Summary  (Last 24 hours) at 04/23/2020 04/03/2020 Last data filed at 04/23/20 04/03/2020 Gross per 24 hour  Intake 2100 ml  Output 1500 ml  Net 600 ml   Filed Weights   04-23-20 0244  Weight: 65.3 kg    Examination: General: Elderly female, on vent  HENT: ETT/OG in place  Lungs: Clear breath sounds Cardiovascular: Irregular, PAC, PVC noted  Abdomen: soft, non-distended, active bowel sounds  Extremities: -edema  Neuro: unresponsive, no cough/gag, does not withdrawal, pupils 3 mm sluggish  GU: foley in place   Resolved Hospital Problem list     Assessment & Plan:   Prolonged V.Fib Arrest -CPR for 40 minutes, unknown downtime  -electrolyte abnormalities noted K 3.4. NA 133  Plan   -Extensive Conversations with Daughter and Son at bedside regarding goals and treatment. Family is very understanding of situation and would not want to continue further medical treatment given situation. Plans transition to comfort care at this time. Extubation orders placed. Along with morphine/ativan PRN. Expect patient will pass very shortly.   Labs   CBC: Recent Labs  Lab 23-Apr-2020 0305 04-23-20 0321 2020/04/23 0413  WBC 20.3*  --   --   NEUTROABS 13.4*  --   --   HGB 12.0 10.9* 11.2*  HCT 38.2 32.0* 33.0*  MCV 96.5  --   --   PLT 238  --   --     Basic Metabolic Panel: Recent Labs  Lab Apr 23, 2020 0305 04-23-20 0321 2020-04-23 0413  NA 133* 133* 136  K 3.4* 3.2* 2.9*  CL 101 101  --   CO2 16*  --   --   GLUCOSE 284* 292*  --   BUN 16 19  --   CREATININE 1.04* 1.00  --   CALCIUM 8.5*  --   --    GFR: Estimated Creatinine Clearance: 32 mL/min (by C-G formula based on SCr of 1 mg/dL). Recent Labs  Lab 04-17-2020 0305  WBC 20.3*  LATICACIDVEN 7.6*    Liver Function Tests: No results for input(s): AST, ALT, ALKPHOS, BILITOT, PROT, ALBUMIN in the last 168 hours. No results for input(s): LIPASE, AMYLASE in the last 168 hours. No results for input(s): AMMONIA in the last 168 hours.  ABG      Component Value Date/Time   PHART 7.209 (L) 04/17/20 0413   PCO2ART 47.0 04-17-2020 0413   PO2ART 87 04-17-20 0413   HCO3 19.1 (L) Apr 17, 2020 0413   TCO2 21 (L) 2020/04/17 0413   ACIDBASEDEF 9.0 (H) 04-17-2020 0413   O2SAT 95.0 2020-04-17 0413     Coagulation Profile: No results for input(s): INR, PROTIME in the last 168 hours.  Cardiac Enzymes: No results for input(s): CKTOTAL, CKMB, CKMBINDEX, TROPONINI in the last 168 hours.  HbA1C: No results found for: HGBA1C  CBG: No results for input(s): GLUCAP in the last 168 hours.  Review of Systems:   Unable to review due to encephalopathy   Past Medical History  She,  has a past medical history of Chronic fatigue, Dehydration, High blood pressure, Mass of pancreas, Nausea, Toenail fungus, and Vertigo.   Surgical History    Past Surgical History:  Procedure Laterality Date   THYROIDECTOMY  1999   Per [facility] new patient packet     Social History   reports that she has never smoked. She has never used smokeless tobacco. She reports previous alcohol use. She reports that she does not use drugs.   Family History   Her family history includes Gaucher's disease in her mother; Heart attack in her brother, father, and mother; High blood pressure in her son; Thyroid disease in her daughter.   Allergies No Known Allergies   Home Medications  Prior to Admission medications   Medication Sig Start Date End Date Taking? Authorizing Provider  Cholecalciferol 25 MCG (1000 UT) capsule Take 1 capsule by mouth daily. 10/19/09  Yes [provider]  diltiazem (CARDIZEM CD) 120 MG 24 hr capsule Take 120 mg by mouth daily. 12/31/19  Yes [provider]  gabapentin (NEURONTIN) 100 MG capsule Take 1 capsule by mouth at bedtime as needed (pain).  12/31/19  Yes [provider]  irbesartan (AVAPRO) 150 MG tablet Take 150 mg by mouth daily. 12/31/19  Yes [provider]  Multiple Vitamin (MULTI-VITAMIN) tablet  Take 2 tablets by mouth daily.    Yes [provider]     Critical care time: 42 minutes     Jovita Kussmaul, AGACNP-BC [facility]  PCCM Pgr: [phone]

## 2020-04-09 NOTE — ED Notes (Addendum)
Pt extubated, comfort care

## 2020-04-09 NOTE — Telephone Encounter (Signed)
Thank you :)

## 2020-04-09 NOTE — ED Provider Notes (Addendum)
[facility] EMERGENCY DEPARTMENT Provider Note  CSN: [id number] Arrival date & time: Apr 23, 2020 0242  Chief Complaint(s) Cardiac Arrest  ED Triage Notes Philipps, Cecile Sheerer, RN (Registered Nurse) . [facility] . [facility] Date of Service: 04-23-2020 2:45 AM . . Signed   BIB EMS from home post arrest. Family heard patient fall approx 0130. Patient noted to be pulseless and apneic, CPR initiated by family within 5 min. Upon EMS arrival patient in vfib. Shocked X3, given 4 epi, 300mg  amio. ROSC achieved 0210. Approx 40 min CPR. Patient was being paced upon arrival to ED with king airway in place. EDP and RT at bedside preparing to switch to ETT. Patient no longer being paced, HR 80's.       HPI Erin Escobar is a 86 y.o. female here for VF arrest. Currently being paced. King inplace.  Remainder of history, ROS, and physical exam limited due to patient's condition (unresponsive). Additional information was obtained from EMS and family.   Level V Caveat.  Spoke with family who reported that the patient had been in her normal state of health up until this evening.  Last seen awake and normal at approximately 8:30 PM.  The daughter who lives with her reports going to bed around 11 PM and patient appeared to be resting comfortably.  Around 1:20 - 1:30 AM, the daughter heard the patient gasping for breath.  When she went to go check on her the patient would not respond.  She immediately called 911 and began CPR.  EMS arrived shortly after (within 5 to 10 minutes) and noted patient was in V. fib.  She was given the above management.  Family reports that the patient was recently being treated for a hyponatremia.  After increasing her sodium they report that the patient had been doing much better.  They reports that she is high functioning.   HPI  Past Medical History Past Medical History:  Diagnosis Date  . Chronic fatigue    Per [facility] new patient packet  . Dehydration    Per  [facility] new patient packet  . High blood pressure    Per [facility] new patient packet  . Mass of pancreas    Per [facility] new patient packet  . Nausea    Per [facility] new patient packet  . Toenail fungus    Per [facility] new patient packet  . Vertigo    Per [facility] new patient packet   Patient Active Problem List   Diagnosis Date Noted  . Postoperative hypothyroidism 02/06/2020  . Vertigo 02/06/2020  . Pain due to onychomycosis of toenail 02/06/2020  . Diverticulosis 02/06/2020  . Essential hypertension 02/06/2020  . H/O partial thyroidectomy 02/06/2020  . Vitamin D deficiency 02/06/2020  . H/O: CVA (cerebrovascular accident) 02/06/2020   Home Medication(s) Prior to Admission medications   Medication Sig Start Date End Date Taking? Authorizing Provider  Cholecalciferol 25 MCG (1000 UT) capsule Take 1 capsule by mouth daily. 10/19/09   [provider]  diltiazem (CARDIZEM CD) 120 MG 24 hr capsule Take 120 mg by mouth daily. 12/31/19   [provider]  gabapentin (NEURONTIN) 100 MG capsule Take 1 capsule by mouth as needed.  12/31/19   [provider]  irbesartan (AVAPRO) 150 MG tablet Take 150 mg by mouth daily. 12/31/19   [provider]  Multiple Vitamin (MULTI-VITAMIN) tablet Take 1 tablet by mouth daily.    [provider]  Past Surgical History Past Surgical History:  Procedure Laterality Date  . THYROIDECTOMY  1999   Per [facility] new patient packet   Family History Family History  Problem Relation Age of Onset  . Heart attack Mother   . Gaucher's disease Mother   . Heart attack Father   . Heart attack Brother   . Thyroid disease Daughter   . High blood pressure Son     Social History Social History   Tobacco Use  . Smoking status: Never Smoker  . Smokeless tobacco: Never Used  Substance Use Topics  . Alcohol use: Not  Currently  . Drug use: Never   Allergies Patient has no known allergies.  Review of Systems Review of Systems  Unable to perform ROS: Patient unresponsive    Physical Exam Vital Signs  I have reviewed the triage vital signs BP (!) 167/70   Pulse 75   Temp (!) 95.4 F (35.2 C)   Resp (!) 0   Ht 5\' 3"  (1.6 m)   Wt 65.3 kg   SpO2 100%   BMI 25.50 kg/m   Physical Exam Vitals reviewed.  Constitutional:      General: She is in acute distress.     Appearance: She is well-developed.  HENT:     Head: Normocephalic and atraumatic.     Comments: King airway in place    Nose: Nose normal.  Eyes:     General: No scleral icterus.       Right eye: No discharge.        Left eye: No discharge.     Conjunctiva/sclera: Conjunctivae normal.     Pupils: Pupils are equal, round, and reactive to light.     Comments: No corneal reflex  Cardiovascular:     Rate and Rhythm: Normal rate and regular rhythm.     Heart sounds: No murmur heard.  No friction rub. No gallop.      Comments: Currently being paced Pulmonary:     Effort: Pulmonary effort is normal. No respiratory distress.     Breath sounds: Normal breath sounds. No stridor. No rales.  Abdominal:     General: There is no distension.     Palpations: Abdomen is soft.  Musculoskeletal:        General: No tenderness.     Cervical back: Normal range of motion and neck supple.  Skin:    General: Skin is warm and dry.     Findings: No erythema or rash.  Neurological:     Mental Status: She is unresponsive.     GCS: GCS eye subscore is 1. GCS verbal subscore is 1. GCS motor subscore is 1.     ED Results and Treatments Labs (all labs ordered are listed, but only abnormal results are displayed) Labs Reviewed  CBC WITH DIFFERENTIAL/PLATELET - Abnormal; Notable for the following components:      Result Value   WBC 20.3 (*)    Neutro Abs 13.4 (*)    Lymphs Abs 5.1 (*)    Abs Immature Granulocytes 0.73 (*)    All other  components within normal limits  BASIC METABOLIC PANEL - Abnormal; Notable for the following components:   Sodium 133 (*)    Potassium 3.4 (*)    CO2 16 (*)    Glucose, Bld 284 (*)    Creatinine, Ser 1.04 (*)    Calcium 8.5 (*)    GFR calc non Af Amer 46 (*)    GFR calc Af  54 (*)    Anion gap 16 (*)    All other components within normal limits  LACTIC ACID, PLASMA - Abnormal; Notable for the following components:   Lactic Acid, Venous 7.6 (*)    All other components within normal limits  I-STAT CHEM 8, ED - Abnormal; Notable for the following components:   Sodium 133 (*)    Potassium 3.2 (*)    Glucose, Bld 292 (*)    Calcium, Ion 1.13 (*)    TCO2 18 (*)    Hemoglobin 10.9 (*)    HCT 32.0 (*)    All other components within normal limits  I-STAT ARTERIAL BLOOD GAS, ED - Abnormal; Notable for the following components:   pH, Arterial 7.209 (*)    Bicarbonate 19.1 (*)    TCO2 21 (*)    Acid-base deficit 9.0 (*)    Potassium 2.9 (*)    Calcium, Ion 1.14 (*)    HCT 33.0 (*)    Hemoglobin 11.2 (*)    All other components within normal limits  TROPONIN I (HIGH SENSITIVITY) - Abnormal; Notable for the following components:   Troponin I (High Sensitivity) 946 (*)    All other components within normal limits  SARS CORONAVIRUS 2 BY RT PCR (HOSPITAL ORDER, PERFORMED IN [facility])  LACTIC ACID, PLASMA  BLOOD GAS, ARTERIAL  URINALYSIS, ROUTINE W REFLEX MICROSCOPIC  TROPONIN I (HIGH SENSITIVITY)                                                                                                                         EKG  EKG Interpretation  Date/Time:  Thursday April 06, 2020 02:55:29 EDT Ventricular Rate:  79 PR Interval:    QRS Duration: 144 QT Interval:  436 QTC Calculation: 500 R Axis:   -67 Text Interpretation: Sinus rhythm RBBB and LAFB NO STEMI. Confirmed by Drema Pry [phone]) on 04-06-20 3:48:16 AM      Radiology CT Head Wo Contrast  Result  Date: 2020-04-06 CLINICAL DATA:  Altered mental status EXAM: CT HEAD WITHOUT CONTRAST TECHNIQUE: Contiguous axial images were obtained from the base of the skull through the vertex without intravenous contrast. COMPARISON:  None. FINDINGS: Brain: Normal anatomic configuration. Parenchymal volume loss is commensurate with the patient's age. Moderate subcortical and periventricular white matter changes are present likely reflecting the sequela of small vessel ischemia. Remote lacunar infarcts are noted within the a left caudate body, left putamen, right thalamus. No abnormal intra or extra-axial mass lesion or fluid collection. No abnormal mass effect or midline shift. No evidence of acute intracranial hemorrhage or infarct. Ventricular size is normal. Cerebellum unremarkable. Vascular: There is asymmetric hyperdensity within the right M1/M2 which could reflect an intraluminal thrombus in the appropriate setting. Skull: Intact Sinuses/Orbits: Paranasal sinuses are clear. Orbits are unremarkable. Other: Mastoid air cells and middle ear cavities are clear. IMPRESSION: Asymmetric hyperdensity involving the right proximal MCA. If there is concordant findings on neurological examination suggestive of  a right MCA distribution infarct, this could be confirmed with CT arteriography. Moderate senescent change. These results will be called to the ordering clinician or representative by the Radiologist Assistant, and communication documented in the PACS or [employer]. Electronically Signed   By: Helyn Numbers MD   On: 12-Apr-2020 05:00   DG Chest Portable 1 View  Result Date: 04/12/20 CLINICAL DATA:  Status post CPR EXAM: PORTABLE CHEST 1 VIEW COMPARISON:  10/16/2003 FINDINGS: Endotracheal tube and gastric catheter are noted in satisfactory position. Cardiac shadow is within normal limits. Diffuse airspace opacity is noted consistent with mild edema. No acute bony abnormality is seen. IMPRESSION: Changes of mild  parenchymal edema. Tubes and lines as described. Electronically Signed   By: Alcide Clever M.D.   On: 04/12/2020 03:18    Pertinent labs & imaging results that were available during my care of the patient were reviewed by me and considered in my medical decision making (see chart for details).  Medications Ordered in ED Medications  norepinephrine (LEVOPHED) 4-5 MG/250ML-% infusion SOLN (0 mcg/kg/min  Hold 04/12/20 0354)  fentaNYL (SUBLIMAZE) injection 50 mcg (has no administration in time range)  fentaNYL (SUBLIMAZE) injection 50 mcg (has no administration in time range)  midazolam (VERSED) injection 1 mg (has no administration in time range)  midazolam (VERSED) injection 1 mg (has no administration in time range)  sodium chloride 0.9 % bolus 1,000 mL (1,000 mLs Intravenous New Bag/Given 04/12/2020 0456)    Followed by  sodium chloride 0.9 % bolus 1,000 mL (1,000 mLs Intravenous New Bag/Given Apr 12, 2020 0456)    Followed by  0.9 %  sodium chloride infusion (1,000 mLs Intravenous New Bag/Given 12-Apr-2020 0457)  levETIRAcetam (KEPPRA) IVPB 1000 mg/100 mL premix (has no administration in time range)  etomidate (AMIDATE) injection (15 mg Intravenous Given 04/12/2020 0256)  rocuronium (ZEMURON) injection (80 mg Intravenous Given 2020/04/12 0256)                                                                                                                                    Procedures .1-3 Lead EKG Interpretation Performed by: Nira Conn, MD Authorized by: Nira Conn, MD     Interpretation: normal     ECG rate:  88   ECG rate assessment: normal     Rhythm: sinus rhythm     Ectopy: none     Conduction: normal   Procedure Name: Intubation Date/Time: 04-12-2020 4:05 AM Performed by: Nira Conn, MD Pre-anesthesia Checklist: Patient identified, Patient being monitored, Emergency Drugs available, Timeout performed and Suction available Oxygen Delivery Method:  Non-rebreather mask Preoxygenation: Pre-oxygenation with 100% oxygen Induction Type: Rapid sequence Ventilation: Mask ventilation without difficulty Laryngoscope Size: Glidescope Grade View: Grade II Tube size: 7.5 mm Number of attempts: 1 Airway Equipment and Method: Rigid stylet Placement Confirmation: ETT inserted through vocal cords under direct vision,  CO2 detector and Breath sounds checked- equal and  bilateral Secured at: 21 cm Tube secured with: ETT holder Difficulty Due To: Difficulty was anticipated    Glidescope laryngoscopy  Date/Time: 04/15/20 4:06 AM Performed by: Nira Conn, MD Authorized by: Nira Conn, MD   Sedation: Patient sedated: yes Sedatives: etomidate  Patient tolerance: patient tolerated the procedure well with no immediate complications Comments: To insert OGT  OG placement  Date/Time: 04/15/20 4:07 AM Performed by: Nira Conn, MD Authorized by: Nira Conn, MD  Patient tolerance: patient tolerated the procedure well with no immediate complications  .Critical Care Performed by: Nira Conn, MD Authorized by: Nira Conn, MD    CRITICAL CARE Performed by: Amadeo Garnet Sheldon Amara Total critical care time: 85 minutes Critical care time was exclusive of separately billable procedures and treating other patients. Critical care was necessary to treat or prevent imminent or life-threatening deterioration. Critical care was time spent personally by me on the following activities: development of treatment plan with patient and/or surrogate as well as nursing, discussions with consultants, evaluation of patient's response to treatment, examination of patient, obtaining history from patient or surrogate, ordering and performing treatments and interventions, ordering and review of laboratory studies, ordering and review of radiographic studies, pulse oximetry and re-evaluation of patient's  condition.    (including critical care time)  Medical Decision Making / ED Course I have reviewed the nursing notes for this encounter and the patient's prior records (if available in EHR or on provided paperwork).   Shacarra Choe was evaluated in Emergency Department on April 15, 2020 for the symptoms described in the history of present illness. She was evaluated in the context of the global COVID-19 pandemic, which necessitated consideration that the patient might be at risk for infection with the SARS-CoV-2 virus that causes COVID-19. Institutional protocols and algorithms that pertain to the evaluation of patients at risk for COVID-19 are in a state of rapid change based on information released by regulatory bodies including the CDC and federal and state organizations. These policies and algorithms were followed during the patient's care in the ED.  Area was secured with a ET tube. Patient was transitioned to our ZOLL. During transition underlying rhythm was noted to be sinus.  External pacing was stopped.  Patient maintain normal sinus rhythm with intact pulses.  EKG without evidence of ST segment elevation MI. Patient taken off of the epi drip and provided with IV fluids.  Screening labs obtain.    Cooling protocol initiated for VF arrest.  Spoke to family, who provided the above history.  On reassessment, patient is maintaining her blood pressures with IV fluids.  Will monitor closely.  Patient maintaining blood pressures on IV fluids.  Labs notable for elevated leukocytosis likely demargination from acute stress reaction.  Elevated troponin which is expected given the cardiac arrest.  Given the lack of ST segment changes on the EKG I have low suspicion for acute MI.  Will consult CC for continued management.  CT scan notable for hypodense lesion in the right MCA region concerning for possible stroke.  Patient given Keppra.  Consult to neurology for evaluation.  Family  updated.  Critical care came to evaluate the patient. After discussion with the family, it was decided to make the patient comfort care. Patient was extubated in the emergency department. Comfort care measures taken.      Final Clinical Impression(s) / ED Diagnoses Final diagnoses:  Cardiac arrest with ventricular fibrillation (HCC)      This chart was dictated using voice  recognition software.  Despite best efforts to proofread,  errors can occur which can change the documentation meaning.     Nira Connardama, Shary Lamos Eduardo, MD 11-07-2019 [phone]

## 2020-04-09 NOTE — Progress Notes (Signed)
Took patient to CT with RN and back to trauma room C without any complications.

## 2020-04-09 NOTE — ED Notes (Signed)
Ice packs applied

## 2020-04-09 NOTE — Progress Notes (Signed)
ABG collected and critical values given to the nurse.

## 2020-04-09 NOTE — ED Triage Notes (Signed)
BIB EMS from home post arrest. Family heard patient fall approx 0130. Patient noted to be pulseless and apneic, CPR initiated by family within 5 min. Upon EMS arrival patient in vfib. Shocked X3, given 4 epi, 300mg  amio. ROSC achieved 0210. Approx 40 min CPR. Patient was being paced upon arrival to ED with king airway in place. EDP and RT at bedside preparing to switch to ETT. Patient no longer being paced, HR 80's.

## 2020-04-09 DEATH — deceased

## 2020-04-15 ENCOUNTER — Telehealth: Payer: Self-pay

## 2020-04-15 NOTE — Telephone Encounter (Signed)
Sharon Seller, NP asked that I call [facility] to confirm that patients death certificate was signed, for someone called our office asking if Shanda Bumps would sign and she agreed, yet to date request has not populated in the electronic death certificate system.  I called and was told death certificate was mailed to [place] on 04/05/2020. I Informed representative that we have not received via mail. Per representative they will send it electronically tomorrow through [facility]

## 2020-04-23 ENCOUNTER — Ambulatory Visit: Payer: Medicare Other | Admitting: Podiatry

## 2020-06-09 ENCOUNTER — Ambulatory Visit: Payer: Medicare Other | Admitting: Nurse Practitioner

## 2021-12-21 IMAGING — CT CT HEAD W/O CM
4 series · 17 of 47 positions shown, 19 images · non-contrast
Comparison: None.

CLINICAL DATA: Altered mental status

EXAM:
CT HEAD WITHOUT CONTRAST
TECHNIQUE: Contiguous axial images were obtained from the base of the skull
through the vertex without intravenous contrast.

[Series 3: head without · axial · non-contrast · 0.43mm/px · z∈[-108,+22]mm · 7 of 36 slices shown, 9 images]
[im 5/36  brain]
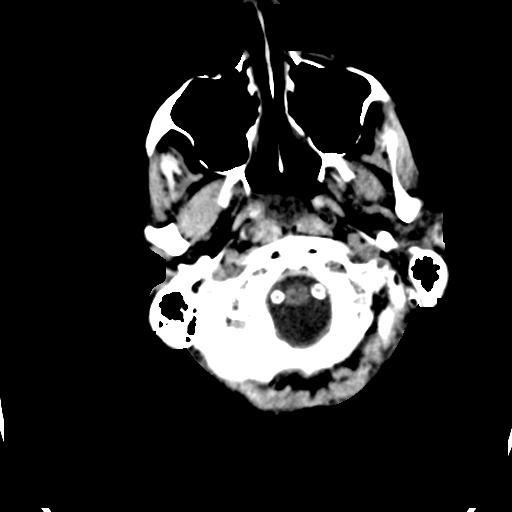
[im 5/36  bone]
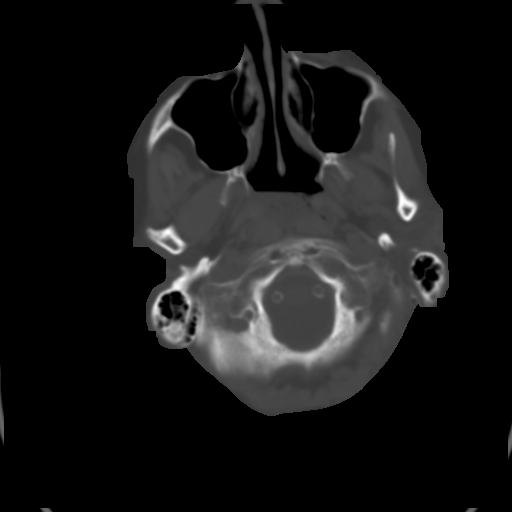
[im 9/36  brain]
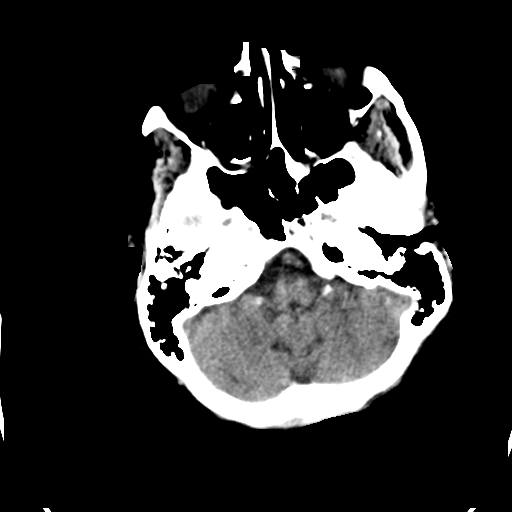
[im 14/36  brain]
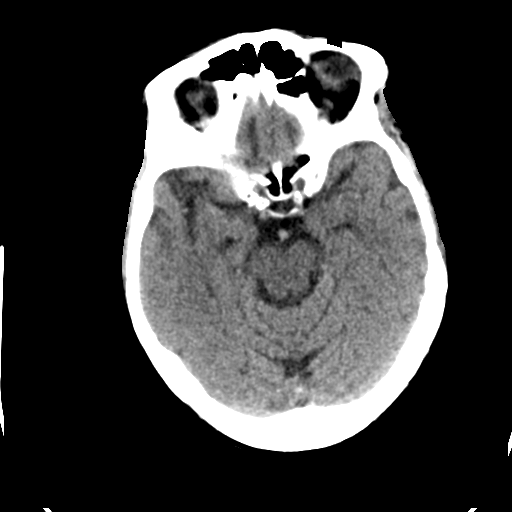
[im 18/36  brain]
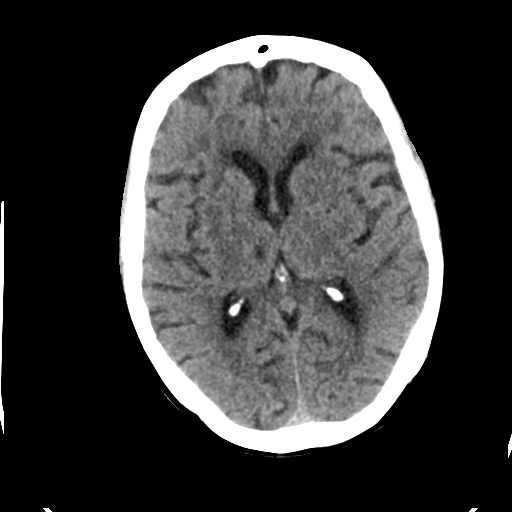
[im 22/36  brain]
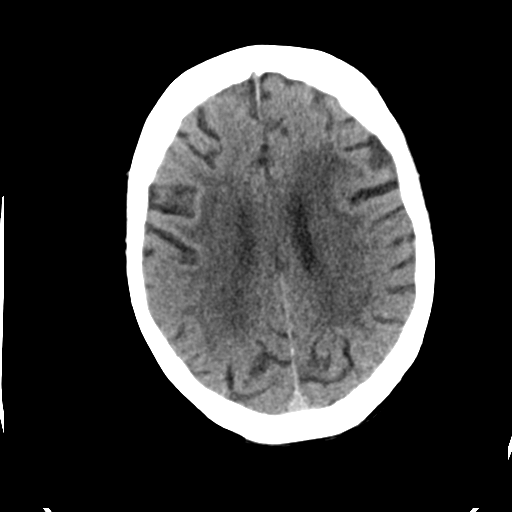
[im 22/36  bone]
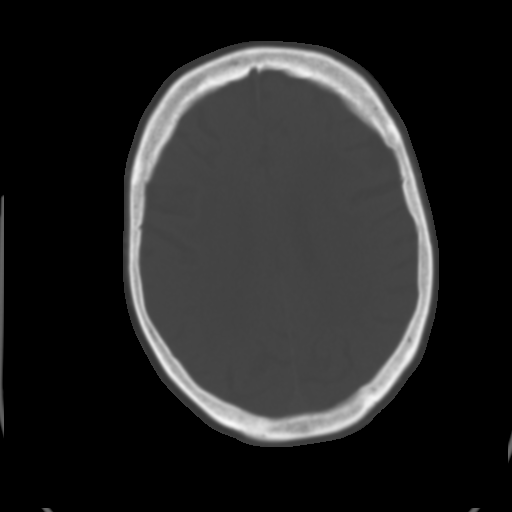
[im 27/36  brain]
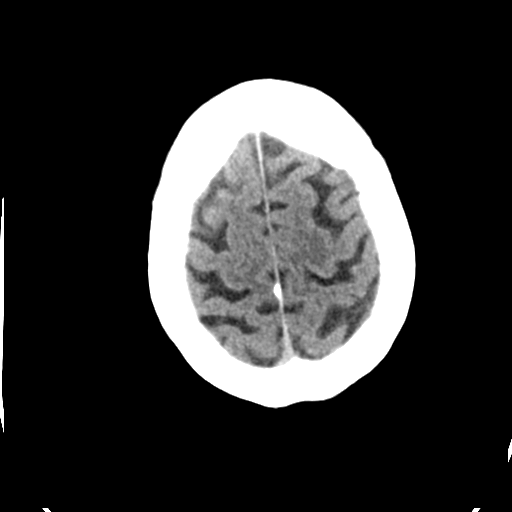
[im 31/36  brain]
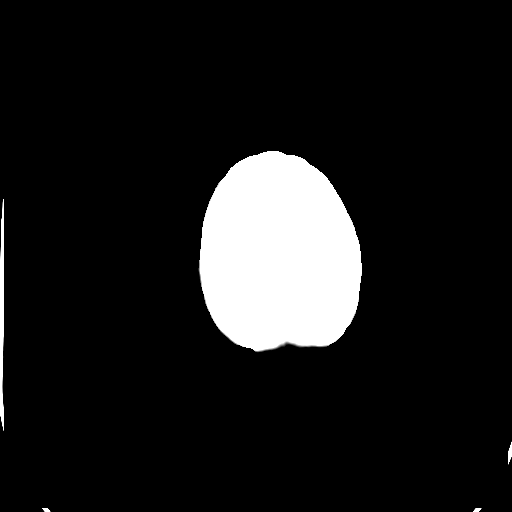

[Series 4: head bone · axial · 0.43mm/px · z∈[-112,-50]mm · 4 of 88 slices shown]
[im 9/88  bone]
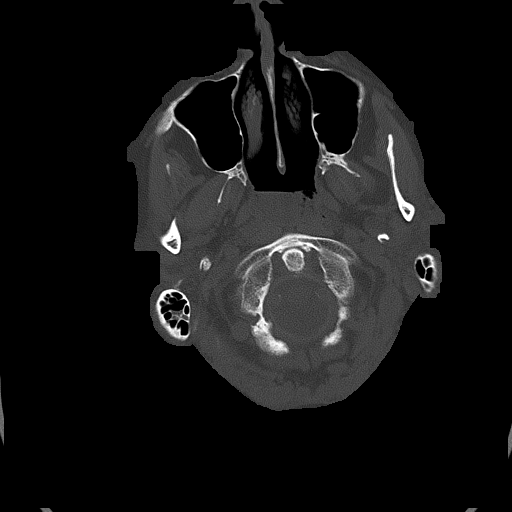
[im 18/88  bone]
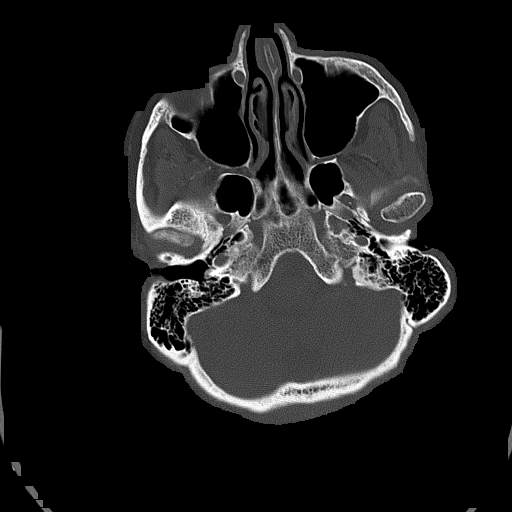
[im 27/88  bone]
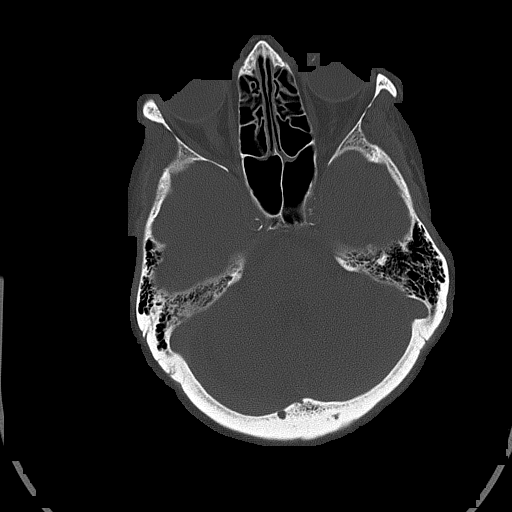
[im 40/88  bone]
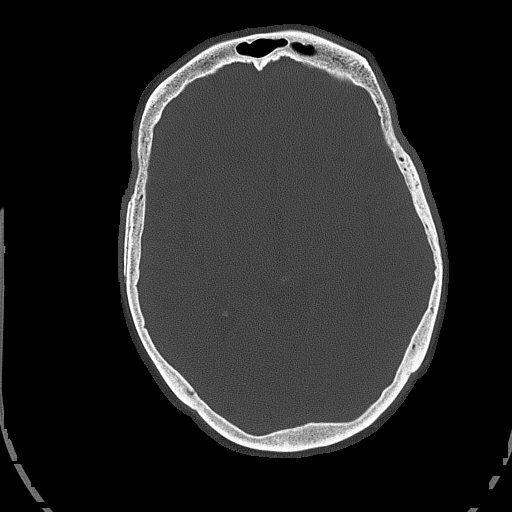

[Series 5: head without cor · coronal · non-contrast · 0.34mm/px · 3 of 67 slices shown]
[im 23/67  brain]
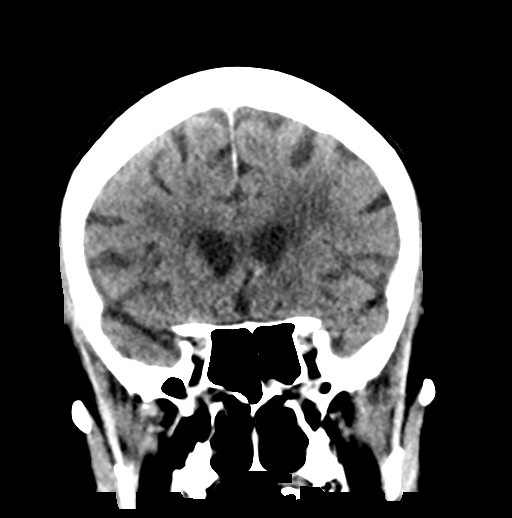
[im 30/67  brain]
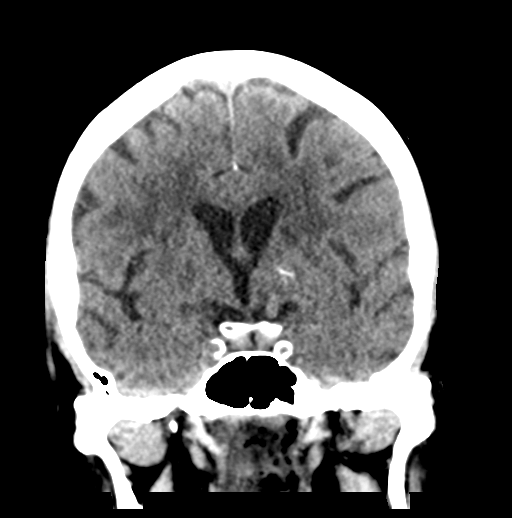
[im 37/67  brain]
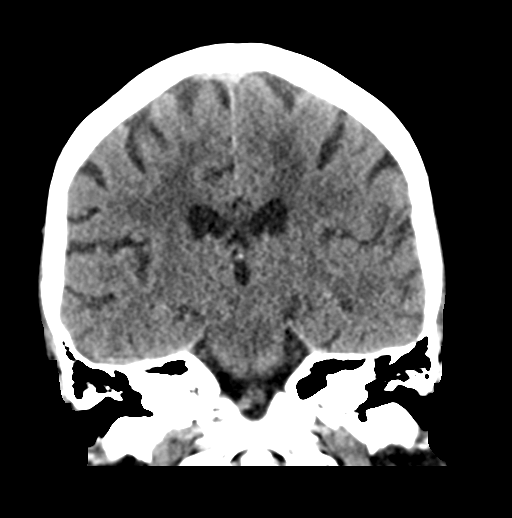

[Series 6: head without sag · sagittal · non-contrast · 0.36mm/px · 3 of 61 slices shown]
[im 21/61  brain]
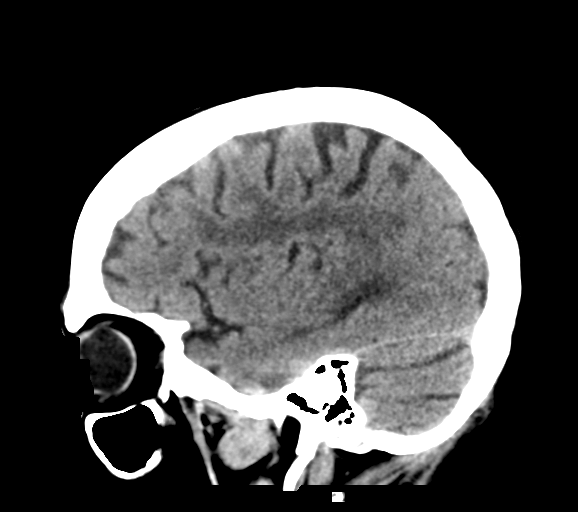
[im 31/61  brain]
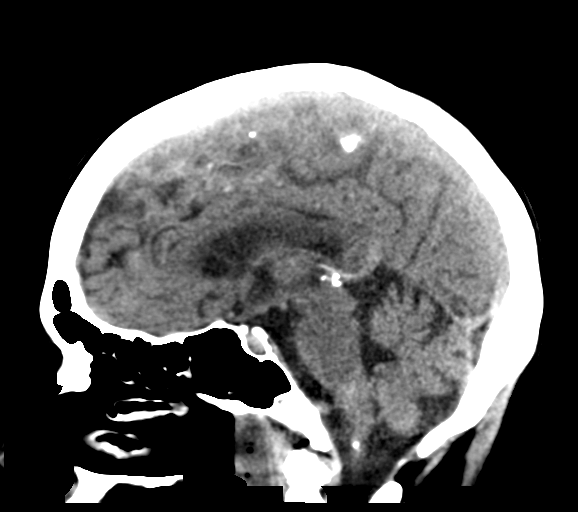
[im 41/61  brain]
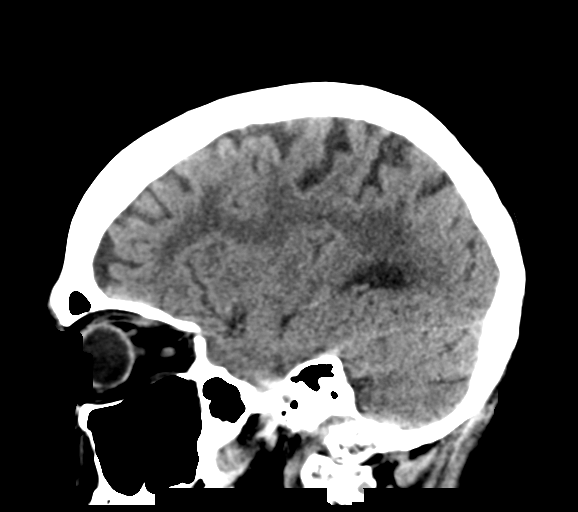

[17 of 47 positions shown; findings below may reference images not displayed]

FINDINGS: Brain: Normal anatomic configuration. Parenchymal volume loss is
commensurate with the patient's age. Moderate subcortical and
periventricular white matter changes are present likely reflecting
the sequela of small vessel ischemia. Remote lacunar infarcts are
noted within the a left caudate body, left putamen, right thalamus.
No abnormal intra or extra-axial mass lesion or fluid collection. No
abnormal mass effect or midline shift. No evidence of acute
intracranial hemorrhage or infarct. Ventricular size is normal.
Cerebellum unremarkable.

Vascular: There is asymmetric hyperdensity within the right M1/M2
which could reflect an intraluminal thrombus in the appropriate
setting.

Skull: Intact

Sinuses/Orbits: Paranasal sinuses are clear. Orbits are
unremarkable.

Other: Mastoid air cells and middle ear cavities are clear.
IMPRESSION: Asymmetric hyperdensity involving the right proximal MCA. If there
is concordant findings on neurological examination suggestive of a
right MCA distribution infarct, this could be confirmed with CT
arteriography.

Moderate senescent change.

These results will be called to the ordering clinician or
representative by the Radiologist Assistant, and communication
documented in the PACS or [REDACTED].
